# Patient Record
Sex: Male | Born: 1960 | Race: Black or African American | Hispanic: No | Marital: Married | Smoking: Current every day smoker
Health system: Southern US, Community
[De-identification: ages and names within clinical notes are randomized; demographics above are authoritative.]

## PROBLEM LIST (undated history)

## (undated) HISTORY — PX: FOOT SURGERY: SHX648

---

## 2010-06-30 ENCOUNTER — Emergency Department (HOSPITAL_COMMUNITY)
Admission: EM | Admit: 2010-06-30 | Discharge: 2010-06-30 | Disposition: A | Discharge status: Home / Self Care | Payer: Self-pay | Attending: Emergency Medicine | Admitting: Emergency Medicine

## 2010-06-30 DIAGNOSIS — R109 Unspecified abdominal pain: Secondary | ICD-10-CM | POA: Insufficient documentation

## 2010-06-30 DIAGNOSIS — R55 Syncope and collapse: Secondary | ICD-10-CM | POA: Insufficient documentation

## 2010-06-30 DIAGNOSIS — R42 Dizziness and giddiness: Secondary | ICD-10-CM | POA: Insufficient documentation

## 2010-06-30 DIAGNOSIS — R110 Nausea: Secondary | ICD-10-CM | POA: Insufficient documentation

## 2010-06-30 LAB — POCT I-STAT, CHEM 8
Calcium, Ion: 1.17 mmol/L (ref 1.12–1.32)
Creatinine, Ser: 1.2 mg/dL (ref 0.4–1.5)
Glucose, Bld: 100 mg/dL — ABNORMAL HIGH (ref 70–99)
Hemoglobin: 16 g/dL (ref 13.0–17.0)
TCO2: 25 mmol/L (ref 0–100)

## 2012-03-12 ENCOUNTER — Ambulatory Visit (INDEPENDENT_AMBULATORY_CARE_PROVIDER_SITE_OTHER): Payer: Managed Care, Other (non HMO) | Admitting: Emergency Medicine

## 2012-03-12 VITALS — BP 126/74 | HR 96 | Temp 98.2°F | Resp 17 | Ht 70.0 in | Wt 222.0 lb

## 2012-03-12 DIAGNOSIS — M722 Plantar fascial fibromatosis: Secondary | ICD-10-CM

## 2012-03-12 MED ORDER — MELOXICAM 15 MG PO TABS: 15.0000 mg | ORAL_TABLET | Freq: Every day | ORAL | Status: DC

## 2012-03-12 NOTE — Progress Notes (Signed)
[facility] [address] [place] [address] [phone]  Date:  03/12/2012   Name:  Michael Long   DOB:  [birth year]   MRN:  [id number]  PCP:  No primary provider on file.    Chief Complaint: Foot Injury   History of Present Illness:  Michael Long is a 51 y.o. very pleasant male patient who presents with the following:  Pain in heel on plantar surface of foot that is nearly incapacitating that has been present for months.  Says the pain is less when he walks on the foot for a while in the morning but never goes away.  Denies injury or fever or chills or redness.  Denies other complaints.  There is no problem list on file for this patient.   No past medical history on file.  No past surgical history on file.  History  Substance Use Topics  . Smoking status: Current Every Day Smoker -- 1.0 packs/day for 30 years    Types: Cigarettes  . Smokeless tobacco: Not on file  . Alcohol Use: No    No family history on file.  No Known Allergies  Medication list has been reviewed and updated.  No current outpatient prescriptions on file prior to visit.    Review of Systems:  As per HPI, otherwise negative.    Physical Examination: Filed Vitals:   03/12/12 1807  BP: 126/74  Pulse: 96  Temp: 98.2 F (36.8 C)  Resp: 17   Filed Vitals:   03/12/12 1807  Height: 5\' 10"  (1.778 m)  Weight: 222 lb (100.699 kg)   Body mass index is 31.85 kg/(m^2). Ideal Body Weight: Weight in (lb) to have BMI = 25: 173.9    GEN: WDWN, NAD, Non-toxic, Alert & Oriented x 3 HEENT: Atraumatic, Normocephalic.  Ears and Nose: No external deformity. EXTR: No clubbing/cyanosis/edema NEURO: Normal gait.  PSYCH: Normally interactive. Conversant. Not depressed or anxious appearing.  Calm demeanor.  Left Foot:  Marked tenderness point just anterior heel.  Worse with dorsiflexion of ankle no cellulitis or crepitus.  Assessment and Plan: Plantar fasciitis mobic Podiatry  consult  Carmelina Dane, MD

## 2012-03-17 NOTE — Progress Notes (Signed)
Reviewed and agree.

## 2013-03-14 ENCOUNTER — Ambulatory Visit: Payer: Managed Care, Other (non HMO) | Admitting: Family Medicine

## 2013-03-14 ENCOUNTER — Ambulatory Visit: Payer: Managed Care, Other (non HMO)

## 2013-03-14 VITALS — BP 124/72 | HR 80 | Temp 98.2°F | Resp 16 | Ht <= 58 in | Wt 215.0 lb

## 2013-03-14 DIAGNOSIS — M25561 Pain in right knee: Secondary | ICD-10-CM

## 2013-03-14 DIAGNOSIS — F17200 Nicotine dependence, unspecified, uncomplicated: Secondary | ICD-10-CM

## 2013-03-14 DIAGNOSIS — M25569 Pain in unspecified knee: Secondary | ICD-10-CM

## 2013-03-14 NOTE — Patient Instructions (Addendum)
Ice knee as frequently as you can.  Start some of the exercises below.  You can use the knee brace to give your knee extra support while you are having to use the left boot for plantar fasciitis. If the pain continues after icing and below exercises, the next step will be to either get you to formal physical therapy or to an orthopedic doctor for further evaluation.  However hopefully below measures and ice will work.  Meniscus Tear with Phase I Rehab The meniscus is a C-shaped cartilage structure, located in the knee joint between the thigh bone (femur) and the shinbone (tibia). Two menisci are located in each knee joint: the inner and outer meniscus. The meniscus acts as an adapter between the thigh bone and shinbone, allowing them to fit properly together. It also functions as a shock absorber, to reduce the stress placed on the knee joint and to help supply nutrients to the knee joint cartilage. As people age, the meniscus begins to harden and become more vulnerable to injury. Meniscus tears are a common injury, especially in older athletes. Inner meniscus tears are more common than outer meniscus tears.  SYMPTOMS   Pain in the knee, especially with standing or squatting with the affected leg.  Tenderness along the joint line.  Swelling in the knee joint (effusion), usually starting 1 to 2 days after injury.  Locking or catching of the knee joint, causing inability to straighten the knee completely.  Giving way or buckling of the knee. CAUSES  A meniscus tear occurs when a force is placed on the meniscus that is greater than it can handle. Common causes of injury include:  Direct hit (trauma) to the knee.  Twisting, pivoting, or cutting (rapidly changing direction while running), kneeling or squatting.  Without injury, due to aging. RISK INCREASES WITH:  Contact sports (football, rugby).  Sports in which cleats are used with pivoting (soccer, lacrosse) or sports in which good shoe  grip and sudden change in direction are required (racquetball, basketball, squash).  Previous knee injury.  Associated knee injury, particularly ligament injuries.  Poor strength and flexibility. PREVENTION  Warm up and stretch properly before activity.  Maintain physical fitness:  Strength, flexibility, and endurance.  Cardiovascular fitness.  Protect the knee with a brace or elastic bandage.  Wear properly fitted protective equipment (proper cleats for the surface). PROGNOSIS  Sometimes, meniscus tears heal on their own. However, definitive treatment requires surgery, followed by at least 6 weeks of recovery.  RELATED COMPLICATIONS   Recurring symptoms that result in a chronic problem.  Repeated knee injury, especially if sports are resumed too soon after injury or surgery.  Progression of the tear (the tear gets larger), if untreated.  Arthritis of the knee in later years (with or without surgery).  Complications of surgery, including infection, bleeding, injury to nerves (numbness, weakness, paralysis) continued pain, giving way, locking, nonhealing of meniscus (if repaired), need for further surgery, and knee stiffness (loss of motion). TREATMENT  Treatment first involves the use of ice and medicine, to reduce pain and inflammation. You may find using crutches to walk more comfortable. However, it is okay to bear weight on the injured knee, if the pain will allow it. Surgery is often advised as a definitive treatment. Surgery is performed through an incision near the joint (arthroscopically). The torn piece of the meniscus is removed, and if possible the joint cartilage is repaired. After surgery, the joint must be restrained. After restraint, it is important to  perform strengthening and stretching exercises to help regain strength and a full range of motion. These exercises may be completed at home or with a therapist.  MEDICATION  If pain medicine is needed, nonsteroidal  anti-inflammatory medicines (aspirin and ibuprofen), or other minor pain relievers (acetaminophen), are often advised.  Do not take pain medicine for 7 days before surgery.  Prescription pain relievers may be given, if your caregiver thinks they are needed. Use only as directed and only as much as you need. HEAT AND COLD  Cold treatment (icing) should be applied for 10 to 15 minutes every 2 to 3 hours for inflammation and pain, and immediately after activity that aggravates your symptoms. Use ice packs or an ice massage.  Heat treatment may be used before performing stretching and strengthening activities prescribed by your caregiver, physical therapist, or athletic trainer. Use a heat pack or a warm water soak. SEEK MEDICAL CARE IF:   Symptoms get worse or do not improve in 2 weeks, despite treatment.  New, unexplained symptoms develop. (Drugs used in treatment may produce side effects.) EXERCISES RANGE OF MOTION (ROM) AND STRETCHING EXERCISES - Meniscus Tear, Non-operative, Phase I These are some of the initial exercises with which you may start your rehabilitation program, until you see your caregiver again or until your symptoms are resolved. Remember:   These initial exercises are intended to be gentle. They will help you restore motion without increasing any swelling.  Completing these exercises allows less painful movement and prepares you for the more aggressive strengthening exercises in Phase II.  An effective stretch should be held for at least 30 seconds.  A stretch should never be painful. You should only feel a gentle lengthening or release in the stretched tissue. RANGE OF MOTION - Knee Flexion, Active  Lie on your back with both knees straight. (If this causes back discomfort, bend your healthy knee, placing your foot flat on the floor.)  Slowly slide your heel back toward your buttocks until you feel a gentle stretch in the front of your knee or thigh.  Hold for  __________ seconds. Slowly slide your heel back to the starting position. Repeat __________ times. Complete this exercise __________ times per day.  RANGE OF MOTION - Knee Flexion and Extension, Active-Assisted  Sit on the edge of a table or chair with your thighs firmly supported. It may be helpful to place a folded towel under the end of your right / left thigh.  Flexion (bending): Place the ankle of your healthy leg on top of the other ankle. Use your healthy leg to gently bend your right / left knee until you feel a mild tension across the top of your knee.  Hold for __________ seconds.  Extension (straightening): Switch your ankles so your right / left leg is on top. Use your healthy leg to straighten your right / left knee until you feel a mild tension on the backside of your knee.  Hold for __________ seconds. Repeat __________ times. Complete __________ times per day. STRETCH - Knee Flexion, Supine  Lie on the floor with your right / left heel and foot lightly touching the wall. (Place both feet on the wall if you do not use a door frame.)  Without using any effort, allow gravity to slide your foot down the wall slowly until you feel a gentle stretch in the front of your right / left knee.  Hold this stretch for __________ seconds. Then return the leg to the starting position,  using your healthy leg for help, if needed. Repeat __________ times. Complete this stretch __________ times per day.  STRETCH - Knee Extension Sitting  Sit with your right / left leg/heel propped on another chair, coffee table, or foot stool.  Allow your leg muscles to relax, letting gravity straighten out your knee.*  You should feel a stretch behind your right / left knee. Hold this position for __________ seconds. Repeat __________ times. Complete this stretch __________ times per day.  *Your physician, physical therapist or athletic trainer may instruct you place a __________ weight on your thigh, just  above your kneecap, to deepen the stretch.  STRENGTHENING EXERCISES - Meniscus Tear, Non-operative, Phase I These exercises may help you when beginning to rehabilitate your injury. They may resolve your symptoms with or without further involvement from your physician, physical therapist or athletic trainer. While completing these exercises, remember:   Muscles can gain both the endurance and the strength needed for everyday activities through controlled exercises.  Complete these exercises as instructed by your physician, physical therapist or athletic trainer. Progress the resistance and repetitions only as guided. STRENGTH - Quadriceps, Isometrics  Lie on your back with your right / left leg extended and your opposite knee bent.  Gradually tense the muscles in the front of your right / left thigh. You should see either your knee cap slide up toward your hip or increased dimpling just above the knee. This motion will push the back of the knee down toward the floor, mat, or bed on which you are lying.  Hold the muscle as tight as you can, without increasing your pain, for __________ seconds.  Relax the muscles slowly and completely between each repetition. Repeat __________ times. Complete this exercise __________ times per day.  STRENGTH - Quadriceps, Short Arcs   Lie on your back. Place a __________ inch towel roll under your right / left knee, so that the knee bends slightly.  Raise only your lower leg by tightening the muscles in the front of your thigh. Do not allow your thigh to rise.  Hold this position for __________ seconds. Repeat __________ times. Complete this exercise __________ times per day.  OPTIONAL ANKLE WEIGHTS: Begin with ____________________, but DO NOT exceed ____________________. Increase in 1 pound/0.5 kilogram increments. STRENGTH - Quadriceps, Straight Leg Raises  Quality counts! Watch for signs that the quadriceps muscle is working, to be sure you are  strengthening the correct muscles and not "cheating" by substituting with healthier muscles.  Lay on your back with your right / left leg extended and your opposite knee bent.  Tense the muscles in the front of your right / left thigh. You should see either your knee cap slide up or increased dimpling just above the knee. Your thigh may even shake a bit.  Tighten these muscles even more and raise your leg 4 to 6 inches off the floor. Hold for __________ seconds.  Keeping these muscles tense, lower your leg.  Relax the muscles slowly and completely in between each repetition. Repeat __________ times. Complete this exercise __________ times per day.  STRENGTH - Hamstring, Curls   Lay on your stomach with your legs extended. (If you lay on a bed, your feet may hang over the edge.)  Tighten the muscles in the back of your thigh to bend your right / left knee up to 90 degrees. Keep your hips flat on the bed.  Hold this position for __________ seconds.  Slowly lower your leg back to  the starting position. Repeat __________ times. Complete this exercise __________ times per day.  STRENGTH  Quadriceps, Squats  Stand in a door frame so that your feet and knees are in line with the frame.  Use your hands for balance, not support, on the frame.  Slowly lower your weight, bending at the hips and knees. Keep your lower legs upright so that they are parallel with the door frame. Squat only within the range that does not increase your knee pain. Never let your hips drop below your knees.  Slowly return upright, pushing with your legs, not pulling with your hands. Repeat __________ times. Complete this exercise __________ times per day.  STRENGTH - Quad/VMO, Isometric   Sit in a chair with your right / left knee slightly bent. With your fingertips, feel the VMO muscle just above the inside of your knee. The VMO is important in controlling the position of your kneecap.  Keeping your fingertips on  this muscle. Without actually moving your leg, attempt to drive your knee down as if straightening your leg. You should feel your VMO tense. If you have a difficult time, you may wish to try the same exercise on your healthy knee first.  Tense this muscle as hard as you can without increasing any knee pain.  Hold for __________ seconds. Relax the muscles slowly and completely in between each repetition. Repeat __________ times. Complete exercise __________ times per day.  Document Released: 04/09/1998 Document Revised: 06/18/2011 Document Reviewed: 07/08/2008 [facility] Patient Information 2014 [place], [place].

## 2013-03-14 NOTE — Progress Notes (Signed)
Subjective:   This chart was scribed for Norberto Sorenson, MD by Arlan Organ, ED Scribe. This patient was seen in room Room/bed 11 and the patient's care was started 8:32 AM.     Patient ID: Michael Long, male    DOB: December 31, 1960, 52 y.o.   MRN: [id number]  Chief Complaint  Patient presents with  . Knee Pain    Right - no falls or injuries x 1 mth    HPI  HPI Comments: Michael Long is a 52 y.o. male who presents to [facility] complaining of gradual onset, constant right knee pain that first started about a 1 month ago.  Pt states he had surgery done for his Left plantar fasciitis, and noted the right medial knee discomfort began 2 d prior. He reports a significant amount of stiffness first thing in the morning in the knee - he has to manually bend the knee after he gets out of bed in the morning. Pt denies any previous knee issues. He denies any known swelling or color change, no h/o any prior knee injuries and no known triggers. Most pain while walking. Pt says he was put on crutches for about 1.5 weeks after his left fasciitis surg, and was the transitioned to a tall cam walker boot for the past 2-3 wks. He denies any current difficulty with ROM in the knee.  He denies currently undergoing physical therapy. He states he currently is not on any anti-inflammatory. He was told not to use nsaids by his podiatrist as it could delay his healing process.  He can use tylenol prn but has not needed this.  History reviewed. No pertinent past medical history.   No current outpatient prescriptions on file prior to visit.   No current facility-administered medications on file prior to visit.    No Known Allergies     Review of Systems  Constitutional: Negative for fever, chills and unexpected weight change.  Musculoskeletal: Positive for arthralgias and gait problem. Negative for joint swelling and myalgias.  Skin: Negative for color change, pallor, rash and wound.  Neurological: Negative for weakness and  numbness.  Hematological: Does not bruise/bleed easily.      BP 124/72  Pulse 80  Temp(Src) 98.2 F (36.8 C) (Oral)  Resp 16  Ht 6" (0.152 m)  Wt 215 lb (97.523 kg)  BMI 4221.04 kg/m2  SpO2 100% Objective:   Physical Exam  Nursing note and vitals reviewed. Constitutional: He is oriented to person, place, and time. He appears well-developed and well-nourished.  HENT:  Head: Normocephalic and atraumatic.  Eyes: EOM are normal.  Neck: Normal range of motion.  Cardiovascular: Normal rate and intact distal pulses.   Pulmonary/Chest: Effort normal and breath sounds normal.  Musculoskeletal: Normal range of motion.  No pain over lateral joint line or LCL No crepitus  No patellar instability  Normal ROM of right lower extremity Tenderness to palpation over posterior medial joint line  No pain over MCL Medial pain with valgus stress and varus stress Negative anterior and posterior drawer and negative lachman  Medial pain at [place]rey's with compression of medial meniscus    Neurological: He is alert and oriented to person, place, and time. Gait abnormal.  Skin: Skin is warm and dry.  Psychiatric: He has a normal mood and affect. His behavior is normal.   Primary X-Ray reading by Dr. Norberto Sorenson: Right knee: Normal  Assessment & Plan:  Tobacco use disorder  Knee pain, right - Plan: DG Knee 1-2 Views  Right - suspect perhaps pt has had a mild meniscal injury but that his current pain is caused by the amount of weight and stress he is having to place on his right knee from using crutches and then cam boot on his left foot.  Spoke to patient regarding X-Ray results, and plans to fit him for a hinge knee brace to help provide some additional strength and support to his right knee while he is wearing his left cam boot. Advised patient to use ice to the area 3-5 times a day - esp since nsaids are currently contraind for him. Provided appropriate exercises for his knee.  If sxs cont, rec cons  formal PT vs MRI w/ ortho eval.  No orders of the defined types were placed in this encounter.   Norberto Sorenson, MD MPH

## 2013-09-10 ENCOUNTER — Ambulatory Visit: Payer: Managed Care, Other (non HMO) | Admitting: Family Medicine

## 2013-09-10 VITALS — BP 117/70 | HR 78 | Temp 97.4°F | Resp 18 | Ht 71.0 in | Wt 212.6 lb

## 2013-09-10 DIAGNOSIS — H8110 Benign paroxysmal vertigo, unspecified ear: Secondary | ICD-10-CM

## 2013-09-10 DIAGNOSIS — M25569 Pain in unspecified knee: Secondary | ICD-10-CM

## 2013-09-10 DIAGNOSIS — R42 Dizziness and giddiness: Secondary | ICD-10-CM

## 2013-09-10 DIAGNOSIS — M25561 Pain in right knee: Secondary | ICD-10-CM

## 2013-09-10 LAB — POCT CBC
Granulocyte percent: 63 %G (ref 37–80)
HCT, POC: 46.9 % (ref 43.5–53.7)
Hemoglobin: 15.2 g/dL (ref 14.1–18.1)
LYMPH, POC: 2.2 (ref 0.6–3.4)
MCH: 29.3 pg (ref 27–31.2)
MCHC: 32.4 g/dL (ref 31.8–35.4)
MCV: 90.6 fL (ref 80–97)
MID (CBC): 0.4 (ref 0–0.9)
MPV: 8.8 fL (ref 0–99.8)
PLATELET COUNT, POC: 332 10*3/uL (ref 142–424)
POC Granulocyte: 4.4 (ref 2–6.9)
POC LYMPH %: 31.6 % (ref 10–50)
POC MID %: 5.4 % (ref 0–12)
RBC: 5.18 M/uL (ref 4.69–6.13)
RDW, POC: 14.6 %
WBC: 7 10*3/uL (ref 4.6–10.2)

## 2013-09-10 LAB — COMPREHENSIVE METABOLIC PANEL
ALBUMIN: 4.4 g/dL (ref 3.5–5.2)
ALK PHOS: 121 U/L — AB (ref 39–117)
ALT: 28 U/L (ref 0–53)
AST: 17 U/L (ref 0–37)
BUN: 10 mg/dL (ref 6–23)
CALCIUM: 9.8 mg/dL (ref 8.4–10.5)
CO2: 25 mEq/L (ref 19–32)
CREATININE: 1 mg/dL (ref 0.50–1.35)
Chloride: 105 mEq/L (ref 96–112)
GLUCOSE: 108 mg/dL — AB (ref 70–99)
POTASSIUM: 4.1 meq/L (ref 3.5–5.3)
Sodium: 138 mEq/L (ref 135–145)
Total Bilirubin: 0.4 mg/dL (ref 0.2–1.2)
Total Protein: 7.2 g/dL (ref 6.0–8.3)

## 2013-09-10 LAB — GLUCOSE, POCT (MANUAL RESULT ENTRY): POC Glucose: 94 mg/dl (ref 70–99)

## 2013-09-10 MED ORDER — MECLIZINE HCL 25 MG PO TABS: ORAL_TABLET | ORAL | Status: AC

## 2013-09-10 NOTE — Progress Notes (Signed)
Subjective: Almost 53 year old man who is here because of dizziness. He has felt lightheaded at work the last couple of days. When he would bend over to lift a palate or something like that he would get a lightheaded. He had taken a break. This morning when he was getting up from bed he rolled on his side to starting to get up and developed a room spinning dizziness. He lightheadedness subsided a good deal. He is not dizzy right this time. He has not had problems with vertigo like this in the past. No nausea or vomiting. He is generally healthy man.  No major illnesses. He has a right knee which caused some pain and problems still. He is not on any regular medications. He is not allergic to any medicines. He does smoke one pack of cigarettes a day as he is done for about 30 years. He has not drink except very occasionally.  Objective: Pleasant alert man in no acute distress. TMs normal. Eyes PERRLA. EOMs intact. Fundi benign. Throat clear. Neck supple without nodes. Chest clear to auscultation. No carotid bruits. Heart regular without any murmurs gallops or arrhythmias. Brief neurologic exam normal. Finger to nose normal. Tandem walk normal.  Assessment: Vertigo Lightheadedness Right knee pain  Plan: Glucose, EKG, CBC, comprehensive metabolic panel.  Results for orders placed in visit on 09/10/13  POCT CBC      Result Value Ref Range   WBC 7.0  4.6 - 10.2 K/uL   Lymph, poc 2.2  0.6 - 3.4   POC LYMPH PERCENT 31.6  10 - 50 %L   MID (cbc) 0.4  0 - 0.9   POC MID % 5.4  0 - 12 %M   POC Granulocyte 4.4  2 - 6.9   Granulocyte percent 63.0  37 - 80 %G   RBC 5.18  4.69 - 6.13 M/uL   Hemoglobin 15.2  14.1 - 18.1 g/dL   HCT, POC 01.6  55.3 - 53.7 %   MCV 90.6  80 - 97 fL   MCH, POC 29.3  27 - 31.2 pg   MCHC 32.4  31.8 - 35.4 g/dL   RDW, POC 74.8     Platelet Count, POC 332  142 - 424 K/uL   MPV 8.8  0 - 99.8 fL  GLUCOSE, POCT (MANUAL RESULT ENTRY)      Result Value Ref Range   POC Glucose  94  70 - 99 mg/dl   The right knee has been bothering him long enough, and he is been wearing a brace on it, soft not like it was time to go ahead and send her to orthopedics for it. Referral is being made.

## 2013-09-10 NOTE — Patient Instructions (Addendum)
Drink more fluids.  Take the meclizine one half to one pill every 6-8 hours as needed for dizziness  Return if worse or not improving  I will let you know the results of your remaining labs in a few days.  We will make a referral to orthopedics for your knee.    Benign Positional Vertigo Vertigo means you feel like you or your surroundings are moving when they are not. Benign positional vertigo is the most common form of vertigo. Benign means that the cause of your condition is not serious. Benign positional vertigo is more common in older adults. CAUSES  Benign positional vertigo is the result of an upset in the labyrinth system. This is an area in the middle ear that helps control your balance. This may be caused by a viral infection, head injury, or repetitive motion. However, often no specific cause is found. SYMPTOMS  Symptoms of benign positional vertigo occur when you move your head or eyes in different directions. Some of the symptoms may include:  Loss of balance and falls.  Vomiting.  Blurred vision.  Dizziness.  Nausea.  Involuntary eye movements (nystagmus). DIAGNOSIS  Benign positional vertigo is usually diagnosed by physical exam. If the specific cause of your benign positional vertigo is unknown, your caregiver may perform imaging tests, such as magnetic resonance imaging (MRI) or computed tomography (CT). TREATMENT  Your caregiver may recommend movements or procedures to correct the benign positional vertigo. Medicines such as meclizine, benzodiazepines, and medicines for nausea may be used to treat your symptoms. In rare cases, if your symptoms are caused by certain conditions that affect the inner ear, you may need surgery. HOME CARE INSTRUCTIONS   Follow your caregiver's instructions.  Move slowly. Do not make sudden body or head movements.  Avoid driving.  Avoid operating heavy machinery.  Avoid performing any tasks that would be dangerous to you or  others during a vertigo episode.  Drink enough fluids to keep your urine clear or pale yellow. SEEK IMMEDIATE MEDICAL CARE IF:   You develop problems with walking, weakness, numbness, or using your arms, hands, or legs.  You have difficulty speaking.  You develop severe headaches.  Your nausea or vomiting continues or gets worse.  You develop visual changes.  Your family or friends notice any behavioral changes.  Your condition gets worse.  You have a fever.  You develop a stiff neck or sensitivity to light. MAKE SURE YOU:   Understand these instructions.  Will watch your condition.  Will get help right away if you are not doing well or get worse. Document Released: 01/01/2006 Document Revised: 06/18/2011 Document Reviewed: 12/14/2010 [facility] Patient Information 2014 [place], [place].

## 2013-11-27 ENCOUNTER — Encounter (HOSPITAL_BASED_OUTPATIENT_CLINIC_OR_DEPARTMENT_OTHER): Admission: RE | Payer: Self-pay | Source: Ambulatory Visit

## 2013-11-27 ENCOUNTER — Ambulatory Visit (HOSPITAL_BASED_OUTPATIENT_CLINIC_OR_DEPARTMENT_OTHER)
Admission: RE | Admit: 2013-11-27 | Payer: Managed Care, Other (non HMO) | Source: Ambulatory Visit | Admitting: Orthopedic Surgery

## 2013-11-27 SURGERY — ARTHROSCOPY, KNEE
Anesthesia: General | Laterality: Right

## 2015-02-15 IMAGING — CR DG KNEE 1-2V*R*
2 series · 2 of 2 positions shown · non-contrast
Comparison: None.

CLINICAL DATA: Right knee pain.

EXAM:
RIGHT KNEE - 1-2 VIEW

[AP]
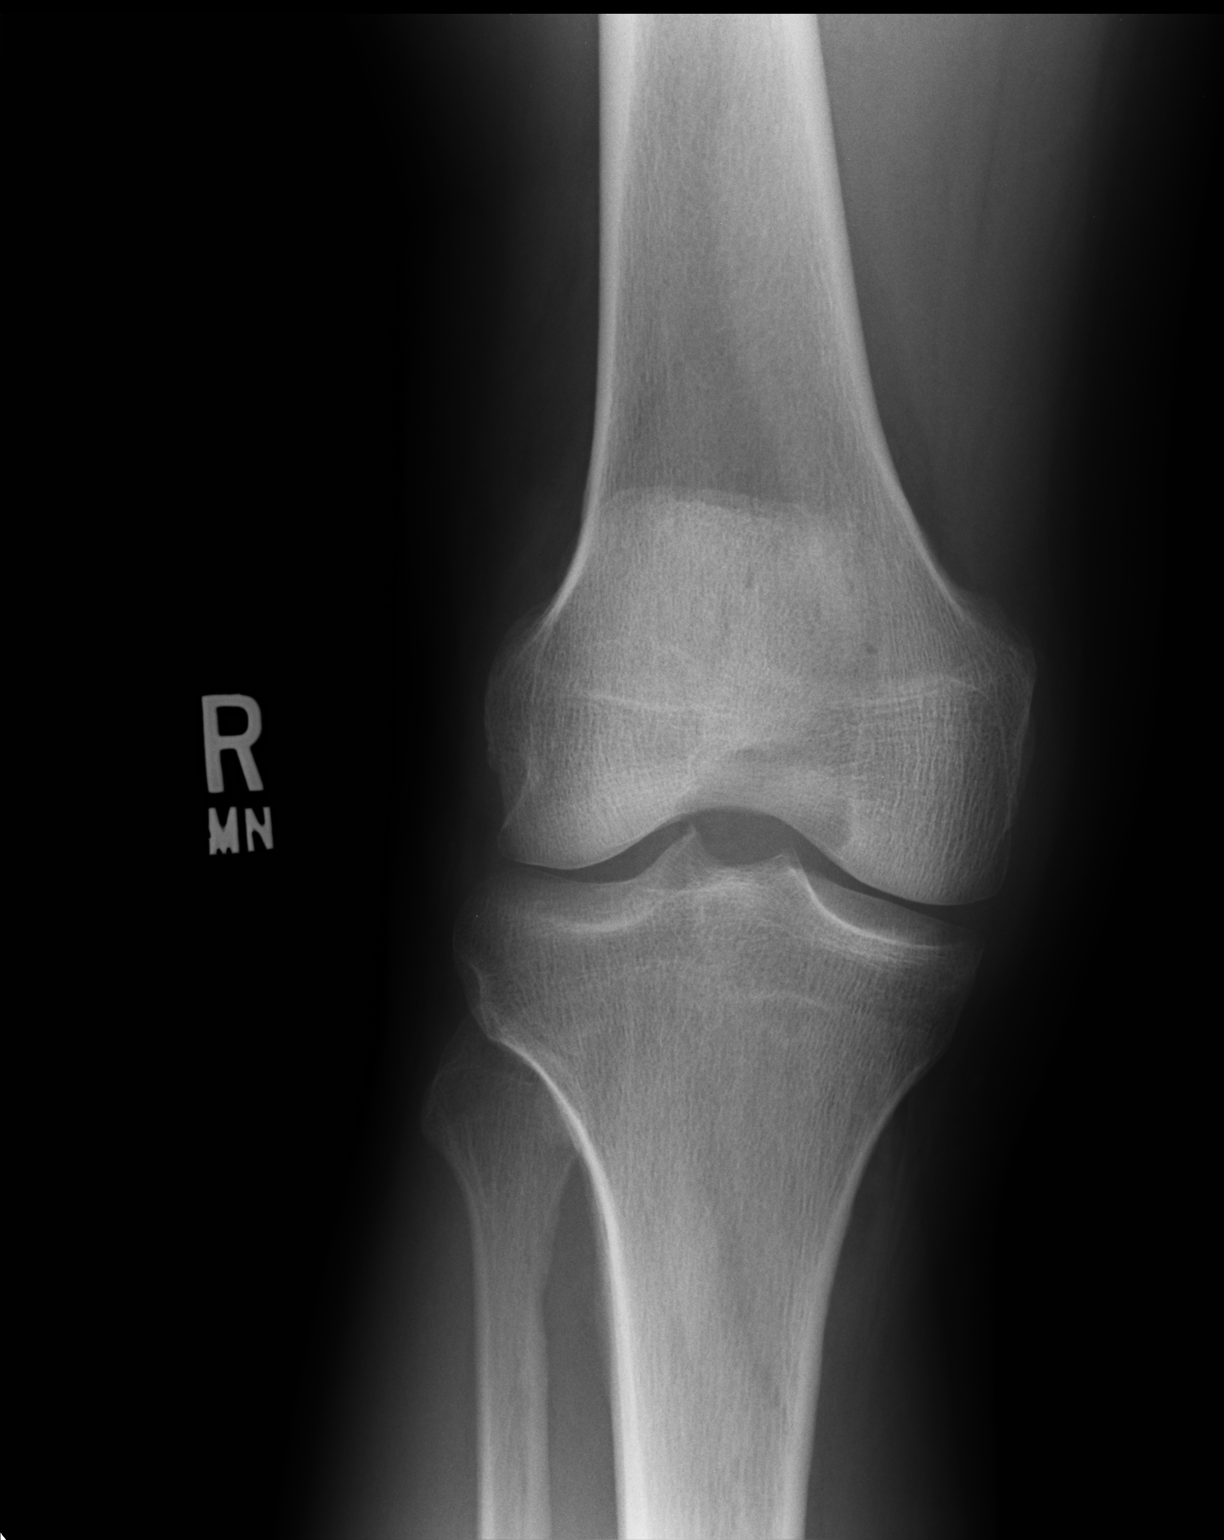

[lateral]
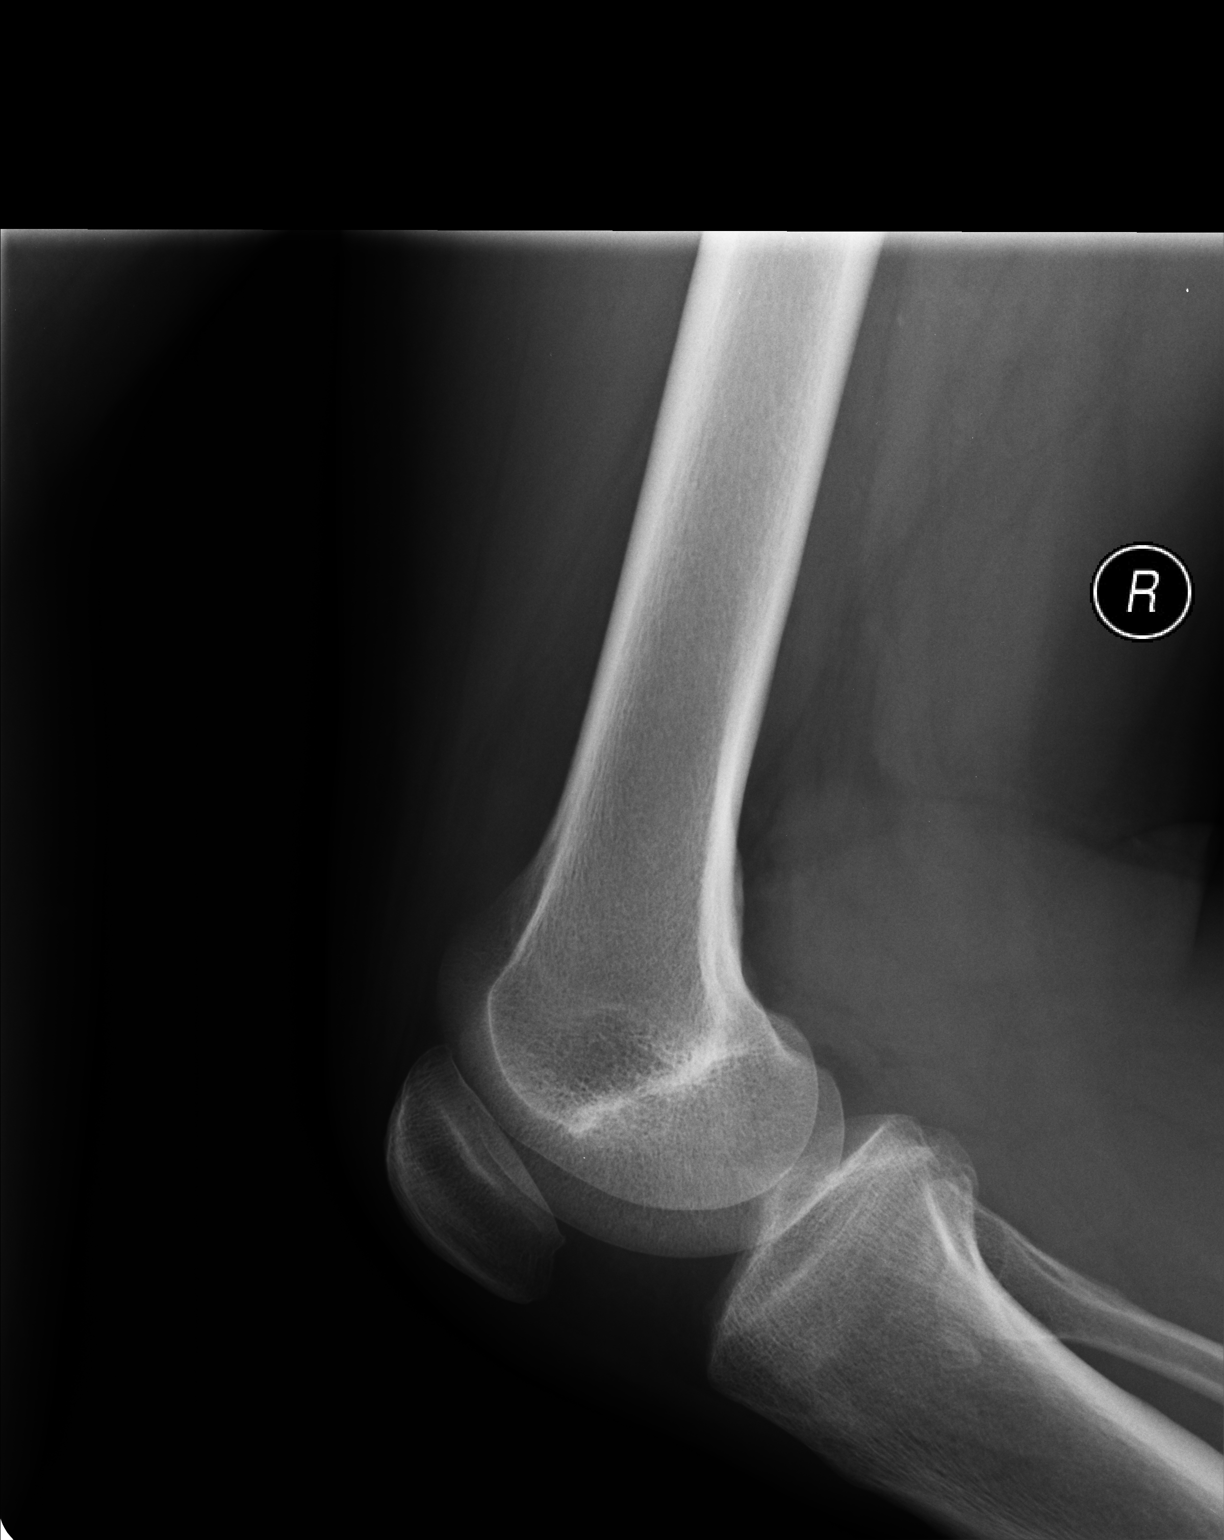

[2 of 2 positions shown; findings below may reference images not displayed]

FINDINGS: There is no evidence of fracture, dislocation, or joint effusion.
There is no evidence of arthropathy or other focal bone abnormality.
Soft tissues are unremarkable.
IMPRESSION: Normal exam.
